# Patient Record
Sex: Male | Born: 1975
Health system: Southern US, Community
[De-identification: ages and names within clinical notes are randomized; demographics above are authoritative.]

## PROBLEM LIST (undated history)

## (undated) ENCOUNTER — Emergency Department (HOSPITAL_BASED_OUTPATIENT_CLINIC_OR_DEPARTMENT_OTHER): Admission: EM | Payer: No Typology Code available for payment source | Source: Home / Self Care

## (undated) HISTORY — PX: HEMORRHOID SURGERY: SHX153

---

## 2004-01-21 ENCOUNTER — Emergency Department (HOSPITAL_COMMUNITY): Admission: EM | Admit: 2004-01-21 | Discharge: 2004-01-21 | Payer: Self-pay | Admitting: Family Medicine

## 2004-11-14 ENCOUNTER — Emergency Department (HOSPITAL_COMMUNITY): Admission: EM | Admit: 2004-11-14 | Discharge: 2004-11-14 | Payer: Self-pay | Admitting: Family Medicine

## 2011-11-13 ENCOUNTER — Ambulatory Visit: Payer: Self-pay | Admitting: Family Medicine

## 2011-11-13 VITALS — BP 112/71 | HR 66 | Temp 98.4°F | Resp 16 | Ht 73.0 in | Wt 276.4 lb

## 2011-11-13 DIAGNOSIS — K645 Perianal venous thrombosis: Secondary | ICD-10-CM

## 2011-11-13 NOTE — Progress Notes (Signed)
  Patient Name: Elijah Hernandez Date of Birth: [birth year] Medical Record Number: [id number] Gender: male Date of Encounter: 11/13/2011  History of Present Illness:  Elijah Hernandez is a 36 y.o. very pleasant male patient who presents with the following:  Here with painful, external hemorrhoid for a few days.  He has tried some OTC creams but to no avail.  No bleeding.  He did have a BM yesterday- but none today.   He is generally healthy.    There is no problem list on file for this patient.  No past medical history on file. No past surgical history on file. History  Substance Use Topics  . Smoking status: Current Everyday Smoker    Types: Cigars  . Smokeless tobacco: Not on file  . Alcohol Use: Not on file   No family history on file. No Known Allergies  Medication list has been reviewed and updated.  Review of Systems: As per HPI- otherwise negative.  Physical Examination: Filed Vitals:   11/13/11 1411  BP: 112/71  Pulse: 66  Temp: 98.4 F (36.9 C)  TempSrc: Oral  Resp: 16  Height: 6\' 1"  (1.854 m)  Weight: 276 lb 6.4 oz (125.374 kg)    Body mass index is 36.47 kg/(m^2).  GEN: WDWN, NAD, Non-toxic, A & O x 3 HEENT: Atraumatic, Normocephalic. Neck supple. No masses, No LAD. Ears and Nose: No external deformity. CV: RRR, No M/G/R. No JVD. No thrill. No extra heart sounds. PULM: CTA B, no wheezes, crackles, rhonchi. No retractions. No resp. distress. No accessory muscle use. ABD: S, NT, ND, +BS. No rebound. No HSM. EXTR: No c/c/e NEURO Normal gait.  PSYCH: Normally interactive. Conversant. Not depressed or anxious appearing.  Calm demeanor.  Large, thrombosed hemorrhoid at 9:00.  VC obtained.  Area cleaned with alcohol pad.  Anesthesia with 1% lidocaine, then excised thrombosis with #11 blade. No complication, large clot expressed  Assessment and Plan: 1. Hemorrhoid thrombosis    Treated as above.  Follow- up if not better in a couple of days, sooner if  prolonged bleeding or other complications.  Sitz baths 2 or 3x daily for the next few days.

## 2012-09-22 ENCOUNTER — Emergency Department (HOSPITAL_COMMUNITY)
Admission: EM | Admit: 2012-09-22 | Discharge: 2012-09-22 | Disposition: A | Discharge status: Home / Self Care | Payer: Self-pay | Attending: Emergency Medicine | Admitting: Emergency Medicine

## 2012-09-22 ENCOUNTER — Encounter (HOSPITAL_COMMUNITY): Payer: Self-pay | Admitting: *Deleted

## 2012-09-22 ENCOUNTER — Emergency Department (HOSPITAL_COMMUNITY): Payer: Self-pay

## 2012-09-22 DIAGNOSIS — F17200 Nicotine dependence, unspecified, uncomplicated: Secondary | ICD-10-CM | POA: Insufficient documentation

## 2012-09-22 DIAGNOSIS — S060X9A Concussion with loss of consciousness of unspecified duration, initial encounter: Secondary | ICD-10-CM

## 2012-09-22 DIAGNOSIS — F1010 Alcohol abuse, uncomplicated: Secondary | ICD-10-CM | POA: Insufficient documentation

## 2012-09-22 DIAGNOSIS — F10929 Alcohol use, unspecified with intoxication, unspecified: Secondary | ICD-10-CM

## 2012-09-22 DIAGNOSIS — W010XXA Fall on same level from slipping, tripping and stumbling without subsequent striking against object, initial encounter: Secondary | ICD-10-CM | POA: Insufficient documentation

## 2012-09-22 DIAGNOSIS — Y939 Activity, unspecified: Secondary | ICD-10-CM | POA: Insufficient documentation

## 2012-09-22 DIAGNOSIS — S060X0A Concussion without loss of consciousness, initial encounter: Secondary | ICD-10-CM | POA: Insufficient documentation

## 2012-09-22 DIAGNOSIS — Y9229 Other specified public building as the place of occurrence of the external cause: Secondary | ICD-10-CM | POA: Insufficient documentation

## 2012-09-22 DIAGNOSIS — S0101XA Laceration without foreign body of scalp, initial encounter: Secondary | ICD-10-CM

## 2012-09-22 MED ORDER — HYDROCODONE-ACETAMINOPHEN 5-325 MG PO TABS: 1.0000 | ORAL_TABLET | ORAL | Status: DC | PRN

## 2012-09-22 MED ORDER — TETANUS-DIPHTH-ACELL PERTUSSIS 5-2.5-18.5 LF-MCG/0.5 IM SUSP
0.5000 mL | Freq: Once | INTRAMUSCULAR | Status: AC
  Administered 2012-09-22: 0.5 mL via INTRAMUSCULAR
  Filled 2012-09-22: qty 0.5

## 2012-09-22 MED ORDER — SODIUM CHLORIDE 0.9 % IV BOLUS (SEPSIS)
1000.0000 mL | Freq: Once | INTRAVENOUS | Status: AC
  Administered 2012-09-22: 1000 mL via INTRAVENOUS

## 2012-09-22 MED ORDER — LIDOCAINE-EPINEPHRINE-TETRACAINE (LET) SOLUTION
3.0000 mL | Freq: Once | NASAL | Status: AC
  Administered 2012-09-22: 3 mL via TOPICAL
  Filled 2012-09-22: qty 3

## 2012-09-22 NOTE — ED Notes (Signed)
0630: Backboard removed. By Ardelle Park

## 2012-09-22 NOTE — ED Provider Notes (Signed)
Medical screening examination/treatment/procedure(s) were performed by non-physician practitioner and as supervising physician I was immediately available for consultation/collaboration.   Skarleth Delmonico, MD 09/22/12 2254 

## 2012-09-22 NOTE — ED Provider Notes (Signed)
History     CSN: [id number]  Arrival date & time 09/22/12  0547   First MD Initiated Contact with Patient 09/22/12 0602      Chief Complaint  Patient presents with  . Fall    (Consider location/radiation/quality/duration/timing/severity/associated sxs/prior treatment) HPI  37 year old male presents to the ED for evaluations of head injury. Patient was brought here to the ER via EMS on spine board and c-collar. Per EMS note patient was at the [facility] house.  He reportedly went outside, and fell on his back hitting his head in the process.  Denies LOC but admits to drinking "some alcohol".  Pt only complaint is headache.   Does not know last tetanus shot.  Denies neck pain, cp, sob, back pain, abd pain, numbness or weakness.   History is limited due to intoxication.  History reviewed. No pertinent past medical history.  History reviewed. No pertinent past surgical history.  History reviewed. No pertinent family history.  History  Substance Use Topics  . Smoking status: Current Every Day Smoker    Types: Cigars  . Smokeless tobacco: Not on file  . Alcohol Use: Yes      Review of Systems  Unable to perform ROS: Other  Intoxication  Allergies  Review of patient's allergies indicates no known allergies.  Home Medications  No current outpatient prescriptions on file.  BP 116/79  Pulse 66  Temp 97.7 F (36.5 C) (Oral)  Resp 14  SpO2 100%  Physical Exam  Nursing note and vitals reviewed. Constitutional: He appears well-developed and well-nourished.       Sleepy but arousable and able to answer simple command  Alcohol breath  HENT:  Head: Normocephalic.       Small occipital head laceration, not actively bleeding.  Ttp.  No crepitus or deformity noted  No hemotympanum, no septal hematoma, no midface tenderness, no malocclusion  Eyes: Pupils are equal, round, and reactive to light.        Pupils 2mm  bilaterally, reactive to light  Neck:       c-collar in place.   Supple.  No midline spine tenderness, stepoff or crepitus  Cardiovascular: Normal rate and regular rhythm.   Pulmonary/Chest: Effort normal and breath sounds normal. He exhibits no tenderness.  Abdominal: Soft. There is no tenderness.  Musculoskeletal: Normal range of motion. He exhibits no edema and no tenderness.  Neurological: He has normal strength. No cranial nerve deficit or sensory deficit. GCS eye subscore is 3. GCS verbal subscore is 5. GCS motor subscore is 5.       A&O x 3    ED Course  Procedures (including critical care time)  LACERATION REPAIR Performed by: Fayrene Helper Authorized by: Fayrene Helper Consent: Verbal consent obtained. Risks and benefits: risks, benefits and alternatives were discussed Consent given by: patient Patient identity confirmed: provided demographic data Prepped and Draped in normal sterile fashion Wound explored  Laceration Location: occipital scalp  Laceration Length: 4cm  No Foreign Bodies seen or palpated  Anesthesia: LET  Local anesthetic: LET  Anesthetic total: 3 ml  Irrigation method: syringe Amount of cleaning: standard  Skin closure: surgical staples  Number of staples: 5  Technique: surgical staple  Patient tolerance: Patient tolerated the procedure well with no immediate complications.   6:23 AM Pt was seen and evaluated.  Pt appears intoxicated and had a recent fall.  Had occipital scalp laceration.  Due to alcohol on board, unable to clear c-spine.  Will obtain head and cspine CT.  Will update tdap, will fix laceration.  Pt otherwise able to answer simple command and in NAD.  VSS  9:02 AM CT scan of his head and neck shows no acute fractures or dislocation. Plan to fix laceration to occipital lobe.  10:01 AM Tdap given.  Lac has been repaired with surgical staples.  Pt is now back to base line and clinically sober.  Is A&Ox3, able to ambulate.  Pt will have family members to come and pick him up.  He is aware of care  instruction for wound care and will return in 5 days for staples removal.  Return sooner if signs of infection.  Will dc with pain meds.  Alcohol cessation discussed.  All questions were answer to patient's satisfaction.   BP 116/79  Pulse 66  Temp 97.7 F (36.5 C) (Oral)  Resp 14  SpO2 100%  I have reviewed nursing notes and vital signs. I personally reviewed the imaging tests through PACS system  I reviewed available ER/hospitalization records thought the EMR   MDM  Patient presented with acute alcohol intoxication. Evidence of head trauma which has been addressed.  Patient observed in the ED for 4 hrs after which was clinically sober. Patient discharged in the care of family member. Information provided on alcohol abuse. Follow up with PCP as needed. Return to ER if alteration of mental status, nausea/vomiting, or other concerns  Impression:   Acute alcohol intoxication Scalp laceration  Plan:   Discharge from ED F/U with PCP PRN Decrease alcohol consumption, binge drinking behavior Return to emergency department urgently if new or worsening symptoms develop.         Fayrene Helper, PA-C 09/22/12 1020

## 2012-09-22 NOTE — ED Notes (Signed)
Patient transported to CT 

## 2012-09-22 NOTE — ED Notes (Signed)
Returned from xray

## 2012-09-22 NOTE — ED Notes (Signed)
[employer]; drank to much. Went outside and fell on back. Hit back of head. No loc. Head wrapped prior to arrival. Good amount of blood on ground. 18 ga lt. A/c 200 cc ns.

## 2012-09-27 ENCOUNTER — Emergency Department (INDEPENDENT_AMBULATORY_CARE_PROVIDER_SITE_OTHER)
Admission: EM | Admit: 2012-09-27 | Discharge: 2012-09-27 | Disposition: A | Discharge status: Home / Self Care | Payer: Self-pay | Source: Home / Self Care | Attending: Family Medicine | Admitting: Family Medicine

## 2012-09-27 ENCOUNTER — Encounter (HOSPITAL_COMMUNITY): Payer: Self-pay | Admitting: Emergency Medicine

## 2012-09-27 DIAGNOSIS — Z4802 Encounter for removal of sutures: Secondary | ICD-10-CM

## 2012-09-27 NOTE — ED Provider Notes (Signed)
History     CSN: [id number]  Arrival date & time 09/27/12  1553   First MD Initiated Contact with Patient 09/27/12 1555      Chief Complaint  Patient presents with  . Suture / Staple Removal    (Consider location/radiation/quality/duration/timing/severity/associated sxs/prior treatment) Patient is a 37 y.o. male presenting with suture removal. The history is provided by the patient.  Suture / Staple Removal  The sutures were placed 3 to 6 days ago. There has been no treatment since the wound repair. There has been no drainage from the wound. There is no redness present. There is no swelling present. The pain has no pain.    History reviewed. No pertinent past medical history.  Past Surgical History  Procedure Date  . Hemorrhoid surgery     No family history on file.  History  Substance Use Topics  . Smoking status: Current Every Day Smoker    Types: Cigars  . Smokeless tobacco: Not on file  . Alcohol Use: Yes      Review of Systems  Constitutional: Negative.   Neurological: Negative.     Allergies  Review of patient's allergies indicates no known allergies.  Home Medications   Current Outpatient Rx  Name  Route  Sig  Dispense  Refill  . HYDROCODONE-ACETAMINOPHEN 5-325 MG PO TABS   Oral   Take 1 tablet by mouth every 4 (four) hours as needed for pain.   12 tablet   0     BP 119/75  Pulse 51  Temp 98.3 F (36.8 C) (Oral)  Resp 18  SpO2 100%  Physical Exam  Nursing note and vitals reviewed. Constitutional: He is oriented to person, place, and time. He appears well-developed and well-nourished.  Neurological: He is alert and oriented to person, place, and time.  Skin: Skin is warm and dry.       Lac to scalp well healed, staples removed, no problems.    ED Course  Procedures (including critical care time)  Labs Reviewed - No data to display No results found.   1. Removal of staples       MDM  Staples removed without  problem         Linna Hoff, MD 09/27/12 [phone]

## 2012-09-27 NOTE — ED Notes (Signed)
Staples to back of scalp, placed at [facility] on 09/22/12.  Site unremarkable

## 2015-10-17 ENCOUNTER — Encounter (HOSPITAL_BASED_OUTPATIENT_CLINIC_OR_DEPARTMENT_OTHER): Payer: Self-pay

## 2015-10-17 ENCOUNTER — Emergency Department (HOSPITAL_BASED_OUTPATIENT_CLINIC_OR_DEPARTMENT_OTHER): Payer: 59

## 2015-10-17 ENCOUNTER — Emergency Department (HOSPITAL_BASED_OUTPATIENT_CLINIC_OR_DEPARTMENT_OTHER)
Admission: EM | Admit: 2015-10-17 | Discharge: 2015-10-17 | Disposition: A | Discharge status: Home / Self Care | Payer: 59 | Attending: Emergency Medicine | Admitting: Emergency Medicine

## 2015-10-17 DIAGNOSIS — R52 Pain, unspecified: Secondary | ICD-10-CM

## 2015-10-17 DIAGNOSIS — F17210 Nicotine dependence, cigarettes, uncomplicated: Secondary | ICD-10-CM | POA: Diagnosis not present

## 2015-10-17 DIAGNOSIS — M79671 Pain in right foot: Secondary | ICD-10-CM

## 2015-10-17 DIAGNOSIS — Z736 Limitation of activities due to disability: Secondary | ICD-10-CM | POA: Diagnosis not present

## 2015-10-17 NOTE — ED Notes (Signed)
Pt presents with rt foot pain, near 4/5toes , pain increases with dorsal flexion, no pain with plantar flexion, no swelling or deformity or discoloration noted to rt foot area, states pain as progressively increased over the past week, ambulation increases pain.

## 2015-10-17 NOTE — Discharge Instructions (Signed)
1. Medications: tylenol or ibuprofen for pain, usual home medications 2. Treatment: rest, drink plenty of fluids, wear shoe for comfort, ice and elevate 3. Follow Up: please followup with orthopedics for persistent symptoms for discussion of your diagnoses and further evaluation after today's visit; if you do not have a primary care doctor use the resource guide provided to find one; please return to the ER for increased pain, swelling, numbness, new or worsening symptoms   Musculoskeletal Pain Musculoskeletal pain is muscle and boney aches and pains. These pains can occur in any part of the body. Your caregiver may treat you without knowing the cause of the pain. They may treat you if blood or urine tests, X-rays, and other tests were normal.  CAUSES There is often not a definite cause or reason for these pains. These pains may be caused by a type of germ (virus). The discomfort may also come from overuse. Overuse includes working out too hard when your body is not fit. Boney aches also come from weather changes. Bone is sensitive to atmospheric pressure changes. HOME CARE INSTRUCTIONS   Ask when your test results will be ready. Make sure you get your test results.  Only take over-the-counter or prescription medicines for pain, discomfort, or fever as directed by your caregiver. If you were given medications for your condition, do not drive, operate machinery or power tools, or sign legal documents for 24 hours. Do not drink alcohol. Do not take sleeping pills or other medications that may interfere with treatment.  Continue all activities unless the activities cause more pain. When the pain lessens, slowly resume normal activities. Gradually increase the intensity and duration of the activities or exercise.  During periods of severe pain, bed rest may be helpful. Lay or sit in any position that is comfortable.  Putting ice on the injured area.  Put ice in a bag.  Place a towel between your  skin and the bag.  Leave the ice on for 15 to 20 minutes, 3 to 4 times a day.  Follow up with your caregiver for continued problems and no reason can be found for the pain. If the pain becomes worse or does not go away, it may be necessary to repeat tests or do additional testing. Your caregiver may need to look further for a possible cause. SEEK IMMEDIATE MEDICAL CARE IF:  You have pain that is getting worse and is not relieved by medications.  You develop chest pain that is associated with shortness or breath, sweating, feeling sick to your stomach (nauseous), or throw up (vomit).  Your pain becomes localized to the abdomen.  You develop any new symptoms that seem different or that concern you. MAKE SURE YOU:   Understand these instructions.  Will watch your condition.  Will get help right away if you are not doing well or get worse.   This information is not intended to replace advice given to you by your health care provider. Make sure you discuss any questions you have with your health care provider.   Document Released: 08/07/2005 Document Revised: 10/30/2011 Document Reviewed: 04/11/2013 Elsevier Interactive Patient Education [employer]

## 2015-10-17 NOTE — ED Notes (Signed)
Patient here with ongoing right foot pain, reports that he stands a lot at work and thinks may be related, denies trauma. Pain worse with ambulation and reports all the pain on top of his foot

## 2015-10-17 NOTE — ED Provider Notes (Signed)
CSN: [id number]     Arrival date & time 10/17/15  1147 History   First MD Initiated Contact with Patient 10/17/15 1212     Chief Complaint  Patient presents with  . Foot Pain    HPI   Elijah Hernandez is a 40 y.o. male with no pertinent PMH who presents to the ED with right foot pain, which he states has been present for the past 2 weeks and progressively worsened since that time. He notes his pain has been constant since Thursday. He reports movement and bearing weight exacerbate his pain. He has tried ibuprofen at home with minimal symptom relief. He denies recent injury, though states he is on his feet frequently, as he works in a [employer]. He denies numbness, weakness, paresthesia.   History reviewed. No pertinent past medical history. Past Surgical History  Procedure Laterality Date  . Hemorrhoid surgery     No family history on file. Social History  Substance Use Topics  . Smoking status: Current Every Day Smoker    Types: Cigars  . Smokeless tobacco: None  . Alcohol Use: Yes     Review of Systems  Musculoskeletal: Positive for arthralgias.  Neurological: Negative for weakness and numbness.      Allergies  Review of patient's allergies indicates no known allergies.  Home Medications   Prior to Admission medications   Medication Sig Start Date End Date Taking? Authorizing Provider  ibuprofen (ADVIL,MOTRIN) 600 MG tablet Take 600 mg by mouth every 8 (eight) hours as needed.   Yes Historical Provider, MD    BP 117/70 mmHg  Pulse 60  Temp(Src) 98.4 F (36.9 C) (Oral)  Resp 18  Ht  (1.854 m)  Wt 127.007 kg  BMI 36.95 kg/m2  SpO2 100% Physical Exam  Constitutional: He is oriented to person, place, and time. He appears well-developed and well-nourished. No distress.  HENT:  Head: Normocephalic and atraumatic.  Right Ear: External ear normal.  Left Ear: External ear normal.  Nose: Nose normal.  Eyes: Conjunctivae and EOM are normal. Right eye exhibits no  discharge. Left eye exhibits no discharge. No scleral icterus.  Neck: Normal range of motion. Neck supple.  Cardiovascular: Normal rate, regular rhythm and intact distal pulses.   Pulmonary/Chest: Effort normal and breath sounds normal. No respiratory distress.  Musculoskeletal: Normal range of motion. He exhibits tenderness. He exhibits no edema.  Tenderness to palpation to dorsal aspect of right third, fourth, and fifth MTPs. No edema, erythema, or heat. Full range of motion of right foot and toes. Distal pulses intact. Strength and sensation intact.  Neurological: He is alert and oriented to person, place, and time. He has normal strength. No sensory deficit.  Skin: Skin is warm and dry. He is not diaphoretic.  Psychiatric: He has a normal mood and affect. His behavior is normal.  Nursing note and vitals reviewed.   ED Course  Procedures (including critical care time)  Labs Review Labs Reviewed - No data to display  Imaging Review Dg Foot Complete Right  10/17/2015  CLINICAL DATA:  40 year old presenting with right foot pain which began 2 weeks ago, acutely worsened in the past 2 days, pain localizing to the 3rd, 4th and 5th MTP joints. No known injury. EXAM: RIGHT FOOT COMPLETE - 3+ VIEW COMPARISON:  None. FINDINGS: No evidence of acute or subacute fracture or dislocation. Joint spaces well preserved. Well-preserved bone mineral density. Very small enthesopathic spur on the posterior calcaneus at the insertion of the Achilles  tendon. No significant intrinsic osseous abnormalities. IMPRESSION: No acute, subacute or significant osseous abnormality. Electronically Signed   By: Hulan Saas M.D.   On: 10/17/2015 12:49   I have personally reviewed and evaluated these images as part of my medical decision-making.   EKG Interpretation None      MDM   Final diagnoses:  Pain aggravated by activities of daily living  Right foot pain    40 year old male presents with right foot pain.  Denies injury. Denies numbness, weakness, paresthesia. Patient is afebrile. Vital signs stable. On exam, he has tenderness to palpation to the dorsal aspect of his right third, fourth, and fifth MTPs. No edema, erythema, or heat. Full range of motion. Patient is neurovascularly intact. Imaging negative for acute, subacute, or significant osseous abnormality. Spoke with patient regarding findings. Will place in hard soled shoe for comfort. Advised to rest, ice, elevate, and take tylenol or ibuprofen for pain. Patient to follow-up with ortho for persistent symptoms. Return precautions discussed. Patient verbalizes his understanding and is in agreement with plan.  BP 117/70 mmHg  Pulse 60  Temp(Src) 98.4 F (36.9 C) (Oral)  Resp 18  Ht  (1.854 m)  Wt 127.007 kg  BMI 36.95 kg/m2  SpO2 100%     Mady Gemma, PA-C 10/17/15 1610  Rolan Bucco, MD 10/18/15 1525

## 2017-01-26 ENCOUNTER — Telehealth: Payer: Self-pay | Admitting: *Deleted

## 2017-01-26 NOTE — Telephone Encounter (Signed)
PreVisit Call attempted. Pt was unaware of appt. States his wife made the appt and would have to check with his work about getting PTO. Pt states he will call back. Unable to complete PreVisit Call.

## 2017-01-29 ENCOUNTER — Ambulatory Visit: Payer: 59 | Admitting: Physician Assistant

## 2018-04-14 ENCOUNTER — Encounter (HOSPITAL_BASED_OUTPATIENT_CLINIC_OR_DEPARTMENT_OTHER): Payer: Self-pay | Admitting: Emergency Medicine

## 2018-04-14 ENCOUNTER — Other Ambulatory Visit: Payer: Self-pay

## 2018-04-14 ENCOUNTER — Emergency Department (HOSPITAL_BASED_OUTPATIENT_CLINIC_OR_DEPARTMENT_OTHER)
Admission: EM | Admit: 2018-04-14 | Discharge: 2018-04-14 | Disposition: A | Discharge status: Home / Self Care | Payer: 59 | Attending: Emergency Medicine | Admitting: Emergency Medicine

## 2018-04-14 DIAGNOSIS — F17210 Nicotine dependence, cigarettes, uncomplicated: Secondary | ICD-10-CM | POA: Insufficient documentation

## 2018-04-14 DIAGNOSIS — M5432 Sciatica, left side: Secondary | ICD-10-CM | POA: Insufficient documentation

## 2018-04-14 MED ORDER — MELOXICAM 15 MG PO TABS
15.0000 mg | ORAL_TABLET | Freq: Every day | ORAL | 0 refills | Status: AC
Start: 2018-04-14 — End: ?

## 2018-04-14 MED ORDER — PREDNISONE 10 MG (21) PO TBPK: ORAL_TABLET | ORAL | 0 refills | Status: AC

## 2018-04-14 MED ORDER — CYCLOBENZAPRINE HCL 10 MG PO TABS
5.0000 mg | ORAL_TABLET | Freq: Two times a day (BID) | ORAL | 0 refills | Status: AC | PRN
Start: 2018-04-14 — End: ?

## 2018-04-14 NOTE — ED Provider Notes (Signed)
[facility] EMERGENCY DEPARTMENT Provider Note   CSN: [id number] Arrival date & time: 04/14/18  1112     History   Chief Complaint Chief Complaint  Patient presents with  . Back Pain    HPI Elijah Hernandez is a 42 y.o. male.  Who presents emergency department chief complaint of left-sided sciatica symptoms.  Patient works in a [employer].  After he got off of work Friday night he noticed that he had some pain in his lower back radiating down the the back of his leg.  Patient states that he is fine at rest but anytime he tries to lean over such as putting on his sock he has sharp shooting pain down the back of the leg.  He states that he had something similar when he was about 42 years old but has not had any issues with it since.  He denies weakness, numbness, saddle anesthesia, loss of bowel or bladder control.  He does not use IV drugs and has had no procedures to his back.  He has been using ibuprofen.  Took 1 of his girlfriends muscle relaxers this morning which helped somewhat but caused him to be sleepy.  He states the pain is achy and at times sharp and electrical.  HPI  History reviewed. No pertinent past medical history.  There are no active problems to display for this patient.   Past Surgical History:  Procedure Laterality Date  . HEMORRHOID SURGERY          Home Medications    Prior to Admission medications   Medication Sig Start Date End Date Taking? Authorizing Provider  ibuprofen (ADVIL,MOTRIN) 600 MG tablet Take 600 mg by mouth every 8 (eight) hours as needed.    [provider]    Family History No family history on file.  Social History Social History   Tobacco Use  . Smoking status: Current Every Day Smoker    Types: Cigars  . Smokeless tobacco: Never Used  Substance Use Topics  . Alcohol use: Yes  . Drug use: No     Allergies   Patient has no known allergies.   Review of Systems Review of Systems Ten systems reviewed  and are negative for acute change, except as noted in the HPI.    Physical Exam Updated Vital Signs BP 124/83 (BP Location: Right Arm)   Pulse 60   Temp 98.2 F (36.8 C) (Oral)   Resp 16   Ht 6\' 1"  (1.854 m)   Wt 108.9 kg   SpO2 100%   BMI 31.66 kg/m   Physical Exam  Constitutional: He is oriented to person, place, and time. He appears well-developed and well-nourished. No distress.  HENT:  Head: Normocephalic and atraumatic.  Eyes: Conjunctivae are normal. No scleral icterus.  Neck: Normal range of motion. Neck supple.  Cardiovascular: Normal rate, regular rhythm and normal heart sounds.  Pulmonary/Chest: Effort normal and breath sounds normal. No respiratory distress.  Abdominal: Soft. There is no tenderness.  Musculoskeletal: He exhibits no edema.  Patient appears to be in mild to moderate pain, antalgic gait noted. Lumbosacral spine area reveals no local tenderness or mass. Painful and reduced LS ROM noted. Straight leg raise is positive at 45 degrees on left. DTR's, motor strength and sensation normal, including heel and toe gait.  Peripheral pulses are palpable.  Neurological: He is alert and oriented to person, place, and time.  Skin: Skin is warm and dry. He is not diaphoretic.  Psychiatric: His  behavior is normal.  Nursing note and vitals reviewed.    ED Treatments / Results  Labs (all labs ordered are listed, but only abnormal results are displayed) Labs Reviewed - No data to display  EKG None  Radiology No results found.  Procedures Procedures (including critical care time)  Medications Ordered in ED Medications - No data to display   Initial Impression / Assessment and Plan / ED Course  I have reviewed the triage vital signs and the nursing notes.  Pertinent labs & imaging results that were available during my care of the patient were reviewed by me and considered in my medical decision making (see chart for details).    With classic symptoms of  sciatica.   No neurological deficits and normal neuro exam.  Patient can walk but states is painful.  No loss of bowel or bladder control.  No concern for cauda equina.  No fever, night sweats, weight loss, h/o cancer, IVDU.  RICE protocol and pain medicine indicated and discussed with patient.    Final Clinical Impressions(s) / ED Diagnoses   Final diagnoses:  Sciatica of left side    ED Discharge Orders    None       Arthor CaptainHarris, Shatara Stanek, PA-C 04/14/18 1315    Mesner, Barbara CowerJason, MD 04/14/18 1450

## 2018-04-14 NOTE — ED Triage Notes (Signed)
L lower back pain x 3 days. Denies injury.

## 2018-04-14 NOTE — Discharge Instructions (Addendum)
SEEK IMMEDIATE MEDICAL ATTENTION IF: New numbness, tingling, weakness, or problem with the use of your arms or legs.  Severe back pain not relieved with medications.  Change in bowel or bladder control.  Increasing pain in any areas of the body (such as chest or abdominal pain).  Shortness of breath, dizziness or fainting.  Nausea (feeling sick to your stomach), vomiting, fever, or sweats.  

## 2018-04-14 NOTE — ED Notes (Signed)
Pt states that he worked Friday 8/23  and experienced no pain in back until Friday evening after work, that pain progressed slowly, and worsened over following 2 days and now experiencing shooting pain down left leg. He states pain is not constant but increases with movement. Decreased pain when at rest, but pain does present as a dull ache.

## 2019-11-29 ENCOUNTER — Emergency Department (HOSPITAL_BASED_OUTPATIENT_CLINIC_OR_DEPARTMENT_OTHER): Payer: No Typology Code available for payment source

## 2019-11-29 ENCOUNTER — Other Ambulatory Visit: Payer: Self-pay

## 2019-11-29 ENCOUNTER — Emergency Department (HOSPITAL_BASED_OUTPATIENT_CLINIC_OR_DEPARTMENT_OTHER)
Admission: EM | Admit: 2019-11-29 | Discharge: 2019-11-29 | Disposition: A | Discharge status: Home / Self Care | Payer: No Typology Code available for payment source | Attending: Emergency Medicine | Admitting: Emergency Medicine

## 2019-11-29 ENCOUNTER — Encounter (HOSPITAL_BASED_OUTPATIENT_CLINIC_OR_DEPARTMENT_OTHER): Payer: Self-pay | Admitting: Emergency Medicine

## 2019-11-29 DIAGNOSIS — R9389 Abnormal findings on diagnostic imaging of other specified body structures: Secondary | ICD-10-CM

## 2019-11-29 DIAGNOSIS — R197 Diarrhea, unspecified: Secondary | ICD-10-CM | POA: Diagnosis present

## 2019-11-29 DIAGNOSIS — D696 Thrombocytopenia, unspecified: Secondary | ICD-10-CM | POA: Insufficient documentation

## 2019-11-29 DIAGNOSIS — E860 Dehydration: Secondary | ICD-10-CM | POA: Diagnosis not present

## 2019-11-29 DIAGNOSIS — R55 Syncope and collapse: Secondary | ICD-10-CM | POA: Insufficient documentation

## 2019-11-29 DIAGNOSIS — Z20822 Contact with and (suspected) exposure to covid-19: Secondary | ICD-10-CM | POA: Insufficient documentation

## 2019-11-29 DIAGNOSIS — R918 Other nonspecific abnormal finding of lung field: Secondary | ICD-10-CM | POA: Diagnosis not present

## 2019-11-29 DIAGNOSIS — F17290 Nicotine dependence, other tobacco product, uncomplicated: Secondary | ICD-10-CM | POA: Insufficient documentation

## 2019-11-29 LAB — COMPREHENSIVE METABOLIC PANEL
ALT: 21 U/L (ref 0–44)
AST: 23 U/L (ref 15–41)
Albumin: 4 g/dL (ref 3.5–5.0)
Alkaline Phosphatase: 53 U/L (ref 38–126)
Anion gap: 8 (ref 5–15)
BUN: 17 mg/dL (ref 6–20)
CO2: 25 mmol/L (ref 22–32)
Calcium: 9.1 mg/dL (ref 8.9–10.3)
Chloride: 105 mmol/L (ref 98–111)
Creatinine, Ser: 1.23 mg/dL (ref 0.61–1.24)
GFR calc Af Amer: >60 mL/min (ref >60)
GFR calc non Af Amer: >60 mL/min (ref >60)
Glucose, Bld: 98 mg/dL (ref 70–99)
Potassium: 4.3 mmol/L (ref 3.5–5.1)
Sodium: 138 mmol/L (ref 135–145)
Total Bilirubin: 1 mg/dL (ref 0.3–1.2)
Total Protein: 7.4 g/dL (ref 6.5–8.1)

## 2019-11-29 LAB — CBC WITH DIFFERENTIAL/PLATELET
Abs Immature Granulocytes: 0.03 10*3/uL (ref 0.00–0.07)
Basophils Absolute: 0 10*3/uL (ref 0.0–0.1)
Basophils Relative: 0 %
Eosinophils Absolute: 0.5 10*3/uL (ref 0.0–0.5)
Eosinophils Relative: 6 %
HCT: 50.2 % (ref 39.0–52.0)
Hemoglobin: 15.6 g/dL (ref 13.0–17.0)
Immature Granulocytes: 0 %
Lymphocytes Relative: 7 %
Lymphs Abs: 0.5 10*3/uL — ABNORMAL LOW (ref 0.7–4.0)
MCH: 29.4 pg (ref 26.0–34.0)
MCHC: 31.1 g/dL (ref 30.0–36.0)
MCV: 94.5 fL (ref 80.0–100.0)
Monocytes Absolute: 0.6 10*3/uL (ref 0.1–1.0)
Monocytes Relative: 8 %
Neutro Abs: 6.3 10*3/uL (ref 1.7–7.7)
Neutrophils Relative %: 79 %
Platelets: 86 10*3/uL — ABNORMAL LOW (ref 150–400)
RBC: 5.31 MIL/uL (ref 4.22–5.81)
RDW: 12.4 % (ref 11.5–15.5)
Smear Review: NORMAL
WBC: 7.9 10*3/uL (ref 4.0–10.5)
nRBC: 0 % (ref 0.0–0.2)

## 2019-11-29 LAB — LIPASE, BLOOD: Lipase: 31 U/L (ref 11–51)

## 2019-11-29 MED ORDER — SODIUM CHLORIDE 0.9 % IV SOLN: Freq: Once | INTRAVENOUS | Status: AC

## 2019-11-29 MED ORDER — ONDANSETRON HCL 4 MG/2ML IJ SOLN
4.0000 mg | Freq: Once | INTRAMUSCULAR | Status: AC
  Administered 2019-11-29: 4 mg via INTRAVENOUS
  Filled 2019-11-29: qty 2

## 2019-11-29 NOTE — ED Provider Notes (Signed)
[facility] EMERGENCY DEPARTMENT Provider Note   CSN: [id number] Arrival date & time: 11/29/19  1538     History Chief Complaint  Patient presents with  . Diarrhea  . Loss of Consciousness    Elijah Hernandez is a 44 y.o. male.  HPI  Patient is a 44 year old male with no pertinent past medical history presented today with diarrhea that began this morning has been watery profuse and approximately 7 episodes of this.  He states that he walked upstairs to go to the restroom walked into the bathroom and passed out briefly.  He states that his wife was immediately at his side and and she denies any seizure-like activity.  Patient states that he did "bump his head "on the way down however he states he has no headache, nausea, vomiting, weakness or fatigue.  He states he feels completely asymptomatic at this time.  He states he has not had an episode of diarrhea since the episode of syncope.  He denies any cardiac history, denies any chest pain, shortness of breath, palpitations.  Denies any lateral leg swelling, denies any headache, denies any history of anemia.  Patient states he came to ED for reassessment.  Denies any fevers or bloody diarrhea or dark or tarry stools.  States he has no history of similar episodes.     History reviewed. No pertinent past medical history.  There are no problems to display for this patient.   Past Surgical History:  Procedure Laterality Date  . HEMORRHOID SURGERY         No family history on file.  Social History   Tobacco Use  . Smoking status: Current Every Day Smoker    Types: Cigars  . Smokeless tobacco: Never Used  Substance Use Topics  . Alcohol use: Yes  . Drug use: No    Home Medications Prior to Admission medications   Medication Sig Start Date End Date Taking? Authorizing Provider  cyclobenzaprine (FLEXERIL) 10 MG tablet Take 0.5-1 tablets (5-10 mg total) by mouth 2 (two) times daily as needed for muscle spasms.  04/14/18   Arthor Captain, PA-C  ibuprofen (ADVIL,MOTRIN) 600 MG tablet Take 600 mg by mouth every 8 (eight) hours as needed.    [provider]  meloxicam (MOBIC) 15 MG tablet Take 1 tablet (15 mg total) by mouth daily. 04/14/18   Arthor Captain, PA-C  predniSONE (STERAPRED UNI-PAK 21 TAB) 10 MG (21) TBPK tablet Use as directed 04/14/18   Arthor Captain, PA-C    Allergies    Patient has no known allergies.  Review of Systems   Review of Systems  Constitutional: Negative for chills and fever.  HENT: Negative for congestion.   Respiratory: Negative for shortness of breath.   Cardiovascular: Negative for chest pain.  Gastrointestinal: Positive for diarrhea and nausea. Negative for abdominal pain and vomiting.  Musculoskeletal: Negative for neck pain.  Neurological: Positive for syncope.    Physical Exam Updated Vital Signs BP 109/73 (BP Location: Left Arm)   Pulse 88   Temp 98.6 F (37 C) (Oral)   Resp 18   Ht 6\' 1"  (1.854 m)   Wt 111.1 kg   SpO2 99%   BMI 32.32 kg/m   Physical Exam Vitals and nursing note reviewed.  Constitutional:      General: He is not in acute distress.    Comments: Pleasant 44 year old male in no acute distress sitting easily in bed.  Able answer questions and follow commands.  HENT:  Head: Normocephalic and atraumatic.     Nose: Nose normal.     Mouth/Throat:     Mouth: Mucous membranes are dry.  Eyes:     General: No scleral icterus. Cardiovascular:     Rate and Rhythm: Normal rate and regular rhythm.     Pulses: Normal pulses.     Heart sounds: Normal heart sounds.  Pulmonary:     Effort: Pulmonary effort is normal. No respiratory distress.     Breath sounds: No wheezing.  Abdominal:     Palpations: Abdomen is soft.     Tenderness: There is no abdominal tenderness. There is no right CVA tenderness, left CVA tenderness, guarding or rebound.  Musculoskeletal:     Cervical back: Normal range of motion.     Right lower leg: No  edema.     Left lower leg: No edema.  Skin:    General: Skin is warm and dry.     Capillary Refill: Capillary refill takes less than 2 seconds.  Neurological:     Mental Status: He is alert. Mental status is at baseline.     Comments: Alert and oriented to self, place, time and event.   Speech is fluent, clear without dysarthria or dysphasia.   Strength 5/5 in upper/lower extremities  Sensation intact in upper/lower extremities   Normal gait.  Negative Romberg. No pronator drift.  Normal finger-to-nose and feet tapping.  CN I not tested  CN II grossly intact visual fields bilaterally. Did not visualize posterior eye.   CN III, IV, VI PERRLA and EOMs intact bilaterally  CN V Intact sensation to sharp and light touch to the face  CN VII facial movements symmetric  CN VIII not tested  CN IX, X no uvula deviation, symmetric rise of soft palate  CN XI 5/5 SCM and trapezius strength bilaterally  CN XII Midline tongue protrusion, symmetric L/R movements   Psychiatric:        Mood and Affect: Mood normal.        Behavior: Behavior normal.     ED Results / Procedures / Treatments   Labs (all labs ordered are listed, but only abnormal results are displayed) Labs Reviewed  CBC WITH DIFFERENTIAL/PLATELET - Abnormal; Notable for the following components:      Result Value   Platelets 86 (*)    Lymphs Abs 0.5 (*)    All other components within normal limits  SARS CORONAVIRUS 2 (TAT 6-24 HRS)  COMPREHENSIVE METABOLIC PANEL  LIPASE, BLOOD    EKG EKG Interpretation  Date/Time:  Saturday November 29 2019 16:19:36 EDT Ventricular Rate:  65 PR Interval:    QRS Duration: 97 QT Interval:  386 QTC Calculation: 402 R Axis:   61 Text Interpretation: Sinus rhythm No previous ECGs available Confirmed by Theotis Burrow [phone]) on 11/29/2019 4:56:19 PM Also confirmed by Theotis Burrow [phone]), editor [place], [place] [phone])  on 11/30/2019 8:52:18 AM   Radiology DG Chest Portable 1  View  Result Date: 11/29/2019 CLINICAL DATA:  Syncope. EXAM: PORTABLE CHEST 1 VIEW COMPARISON:  None. FINDINGS: The heart, hila, and mediastinum are normal. No pneumothorax. Subtle density/infiltrate in the right mid lung. No other acute abnormalities. IMPRESSION: Subtle density/infiltrate in the right mid lung may represent pneumonia or aspiration. Recommend short-term follow-up imaging to ensure resolution. Electronically Signed   By: Dorise Bullion III M.D   On: 11/29/2019 16:33    Procedures Procedures (including critical care time)  Medications Ordered in ED Medications  0.9 %  sodium chloride infusion ( Intravenous Stopped 11/29/19 1733)  ondansetron (ZOFRAN) injection 4 mg (4 mg Intravenous Given 11/29/19 1617)    ED Course  I have reviewed the triage vital signs and the nursing notes.  Pertinent labs & imaging results that were available during my care of the patient were reviewed by me and considered in my medical decision making (see chart for details).    MDM Rules/Calculators/A&P                      Patient is a 44 year old male with no significant past medical history presented today for diarrhea that began today and a single episode of syncope.  He states that he was feeling lightheaded prior to the episode.  Is somewhat nauseous right before the episode occurred.  He does not have any red flag prodromal symptoms such as headache, shortness of breath, chest pain, abdominal pain.  He states that he felt lightheaded several times before when he stood up but states that he felt better after a moment of stabbing himself.  Physical exam is notable for dry mucous membranes however patient does have skin with good turgor does not appear to be emergently dehydrated however as he has had an episode of syncope will rehydrate check electrolytes obtain EKG, basic labs and chest x-ray to evaluate for mediastinal widening.  Chest x-ray apparently reviewed myself.  There is a small  irregularity in the right middle lobe but is likely a incidental finding as patient has absolutely no shortness of breath, cough, wheezing, or other pulmonary complaints.  I reviewed this finding with the patient and recommended close follow-up with PCP for repeat x-ray.  Patient also on CBC has a mild incidental thrombocytopenia of 86.  I discussed this finding patient as well and recommended follow-up with PCP and repeat CBC in 2 to 3 weeks.  He is aware of these will abnormalities.  No abnormalities in electrolytes, potassium is within normal months.  Lipase is within normal limits and Covid test negative.  Patient's vital signs are within normal limits at this time.  I reassessed patient and continues to have no abdominal pain or tenderness to palpation of abdomen.  He continues to feel well.  EKG is within normal limits as well.  No evidence of ischemia.  There is some mild BER.  Patient feels significantly improved and is requesting discharge this time.  After shared decision-making ambulation with wife at bedside and patient will discharge with close follow-up with PCP.  Patient given recommendations for diet will help with diarrhea and given hydration recommendations.    -----  The medical records were personally reviewed by myself. I personally reviewed all lab results and interpreted all imaging studies and either concurred with their official read or contacted radiology for clarification. Additional history obtained from old records/EMS/family members.  This patient appears reasonably screened and I doubt any other medical condition requiring further workup, evaluation, or treatment in the ED at this time prior to discharge.   Patient's vitals are WNL apart from vital sign abnormalities discussed above, patient is in NAD, and able to ambulate in the ED at their baseline and able to tolerate PO.  I discussed this case with my attending physician who cosigned this note including patient's  presenting symptoms, physical exam, and planned diagnostics and interventions. Attending physician stated agreement with plan or made changes to plan which were implemented.   Pain has been managed or a plan has been made for  home management and has no complaints prior to discharge. Patient is comfortable with above plan and for discharge at this time. All questions were answered prior to disposition. Results from the ER workup discussed with the patient face to face and all questions answered to the best of my ability. The patient is safe for discharge with strict return precautions. Patient appears safe for discharge with appropriate follow-up. Conveyed my impression with the patient and they voiced understanding and are agreeable to plan.   An After Visit Summary was printed and given to the patient.  Portions of this note were generated with Scientist, clinical (histocompatibility and immunogenetics). Dictation errors may occur despite best attempts at proofreading.    Final Clinical Impression(s) / ED Diagnoses Final diagnoses:  Dehydration  Diarrhea, unspecified type  Thrombocytopenia (HCC)  Abnormal chest x-ray  Suspected COVID-19 virus infection    Rx / DC Orders ED Discharge Orders    None       Gailen Shelter, [place] 11/30/19 0913    Little, Ambrose [place], MD 11/30/19 1456

## 2019-11-29 NOTE — ED Notes (Signed)
Reports nausea relief.  Ginger ale provided for po challenge.

## 2019-11-29 NOTE — ED Triage Notes (Signed)
Reports having diarrhea today (6-7x). He states he suddenly became hot and nauseated and passed out in his bedroom, striking head on the dresser. States he feels good right now, denies pain.

## 2019-11-29 NOTE — Discharge Instructions (Addendum)
Please use loperamide as an over-the-counter medication for diarrhea.  Please eat the diet that I recommended for diarrhea.  Please follow-up with your primary care doctor in 2 to 3 weeks for reevaluation of your mildly abnormal blood count/platelets and your small incidental abnormality, and your chest x-ray.  As we discussed this is not concerning for an acute issue or something related to was going today however it should be followed up on.  Please use Pedialyte and water to hydrate.  Please return to ED for any new or concerning symptoms.  As we discussed if you experience a fever, abdominal pain, vomiting, or new or concerning symptoms please return to ED for reevaluation.     Your COVID test is pending; you should expect results in 2-3 days. You can access your results on your MyChart--if you test positive you should receive a phone call.  In the meantime follow CDC guidelines and quarantine, wear a mask, wash hands often.   Please take over the counter vitamin D 2000-4000 units per day. I also recommend zinc 50 mg per day for the next two weeks.   Please return to ED if you feel have difficulty breathing or have emergent, new or concerning symptoms.  Patients who have symptoms consistent with COVID-19 should self isolated for: At least 3 days (72 hours) have passed since recovery, defined as resolution of fever without the use of fever reducing medications and improvement in respiratory symptoms (e.g., cough, shortness of breath), and At least 7 days have passed since symptoms first appeared.       Person Under Monitoring Name: Elijah Hernandez  Location: [address] [place] [address]   Infection Prevention Recommendations for Individuals Confirmed to have, or Being Evaluated for, 2019 Novel Coronavirus (COVID-19) Infection Who Receive Care at Home  Individuals who are confirmed to have, or are being evaluated for, COVID-19 should follow the prevention steps below until  a healthcare provider or local or state health department says they can return to normal activities.  Stay home except to get medical care You should restrict activities outside your home, except for getting medical care. Do not go to work, school, or public areas, and do not use public transportation or taxis.  Call ahead before visiting your doctor Before your medical appointment, call the healthcare provider and tell them that you have, or are being evaluated for, COVID-19 infection. This will help the healthcare provider's office take steps to keep other people from getting infected. Ask your healthcare provider to call the local or state health department.  Monitor your symptoms Seek prompt medical attention if your illness is worsening (e.g., difficulty breathing). Before going to your medical appointment, call the healthcare provider and tell them that you have, or are being evaluated for, COVID-19 infection. Ask your healthcare provider to call the local or state health department.  Wear a facemask You should wear a facemask that covers your nose and mouth when you are in the same room with other people and when you visit a healthcare provider. People who live with or visit you should also wear a facemask while they are in the same room with you.  Separate yourself from other people in your home As much as possible, you should stay in a different room from other people in your home. Also, you should use a separate bathroom, if available.  Avoid sharing household items You should not share dishes, drinking glasses, cups, eating utensils, towels, bedding, or other items  with other people in your home. After using these items, you should wash them thoroughly with soap and water.  Cover your coughs and sneezes Cover your mouth and nose with a tissue when you cough or sneeze, or you can cough or sneeze into your sleeve. Throw used tissues in a lined trash can, and immediately wash  your hands with soap and water for at least 20 seconds or use an alcohol-based hand rub.  Wash your [employer] your hands often and thoroughly with soap and water for at least 20 seconds. You can use an alcohol-based hand sanitizer if soap and water are not available and if your hands are not visibly dirty. Avoid touching your eyes, nose, and mouth with unwashed hands.   Prevention Steps for Caregivers and Household Members of Individuals Confirmed to have, or Being Evaluated for, COVID-19 Infection Being Cared for in the Home  If you live with, or provide care at home for, a person confirmed to have, or being evaluated for, COVID-19 infection please follow these guidelines to prevent infection:  Follow healthcare provider's instructions Make sure that you understand and can help the patient follow any healthcare provider instructions for all care.  Provide for the patient's basic needs You should help the patient with basic needs in the home and provide support for getting groceries, prescriptions, and other personal needs.  Monitor the patient's symptoms If they are getting sicker, call his or her medical provider and tell them that the patient has, or is being evaluated for, COVID-19 infection. This will help the healthcare provider's office take steps to keep other people from getting infected. Ask the healthcare provider to call the local or state health department.  Limit the number of people who have contact with the patient If possible, have only one caregiver for the patient. Other household members should stay in another home or place of residence. If this is not possible, they should stay in another room, or be separated from the patient as much as possible. Use a separate bathroom, if available. Restrict visitors who do not have an essential need to be in the home.  Keep older adults, very young children, and other sick people away from the patient Keep older adults,  very young children, and those who have compromised immune systems or chronic health conditions away from the patient. This includes people with chronic heart, lung, or kidney conditions, diabetes, and cancer.  Ensure good ventilation Make sure that shared spaces in the home have good air flow, such as from an air conditioner or an opened window, weather permitting.  Wash your hands often Wash your hands often and thoroughly with soap and water for at least 20 seconds. You can use an alcohol based hand sanitizer if soap and water are not available and if your hands are not visibly dirty. Avoid touching your eyes, nose, and mouth with unwashed hands. Use disposable paper towels to dry your hands. If not available, use dedicated cloth towels and replace them when they become wet.  Wear a facemask and gloves Wear a disposable facemask at all times in the room and gloves when you touch or have contact with the patient's blood, body fluids, and/or secretions or excretions, such as sweat, saliva, sputum, nasal mucus, vomit, urine, or feces.  Ensure the mask fits over your nose and mouth tightly, and do not touch it during use. Throw out disposable facemasks and gloves after using them. Do not reuse. Wash your hands immediately after removing  your facemask and gloves. If your personal clothing becomes contaminated, carefully remove clothing and launder. Wash your hands after handling contaminated clothing. Place all used disposable facemasks, gloves, and other waste in a lined container before disposing them with other household waste. Remove gloves and wash your hands immediately after handling these items.  Do not share dishes, glasses, or other household items with the patient Avoid sharing household items. You should not share dishes, drinking glasses, cups, eating utensils, towels, bedding, or other items with a patient who is confirmed to have, or being evaluated for, COVID-19 infection. After  the person uses these items, you should wash them thoroughly with soap and water.  Wash laundry thoroughly Immediately remove and wash clothes or bedding that have blood, body fluids, and/or secretions or excretions, such as sweat, saliva, sputum, nasal mucus, vomit, urine, or feces, on them. Wear gloves when handling laundry from the patient. Read and follow directions on labels of laundry or clothing items and detergent. In general, wash and dry with the warmest temperatures recommended on the label.  Clean all areas the individual has used often Clean all touchable surfaces, such as counters, tabletops, doorknobs, bathroom fixtures, toilets, phones, keyboards, tablets, and bedside tables, every day. Also, clean any surfaces that may have blood, body fluids, and/or secretions or excretions on them. Wear gloves when cleaning surfaces the patient has come in contact with. Use a diluted bleach solution (e.g., dilute bleach with 1 part bleach and 10 parts water) or a household disinfectant with a label that says EPA-registered for coronaviruses. To make a bleach solution at home, add 1 tablespoon of bleach to 1 quart (4 cups) of water. For a larger supply, add  cup of bleach to 1 gallon (16 cups) of water. Read labels of cleaning products and follow recommendations provided on product labels. Labels contain instructions for safe and effective use of the cleaning product including precautions you should take when applying the product, such as wearing gloves or eye protection and making sure you have good ventilation during use of the product. Remove gloves and wash hands immediately after cleaning.  Monitor yourself for signs and symptoms of illness Caregivers and household members are considered close contacts, should monitor their health, and will be asked to limit movement outside of the home to the extent possible. Follow the monitoring steps for close contacts listed on the symptom monitoring  form.   ? If you have additional questions, contact your local health department or call the epidemiologist on call at [phone] (available 24/7). ? This guidance is subject to change. For the most up-to-date guidance from [facility], please refer to their website: YouBlogs.pl

## 2019-11-30 LAB — SARS CORONAVIRUS 2 (TAT 6-24 HRS): SARS Coronavirus 2: NEGATIVE

## 2020-09-23 IMAGING — DX DG CHEST 1V PORT
1 series · 1 of 1 positions shown · non-contrast
Comparison: None.

CLINICAL DATA: Syncope.

EXAM:
PORTABLE CHEST 1 VIEW

[chest ap]
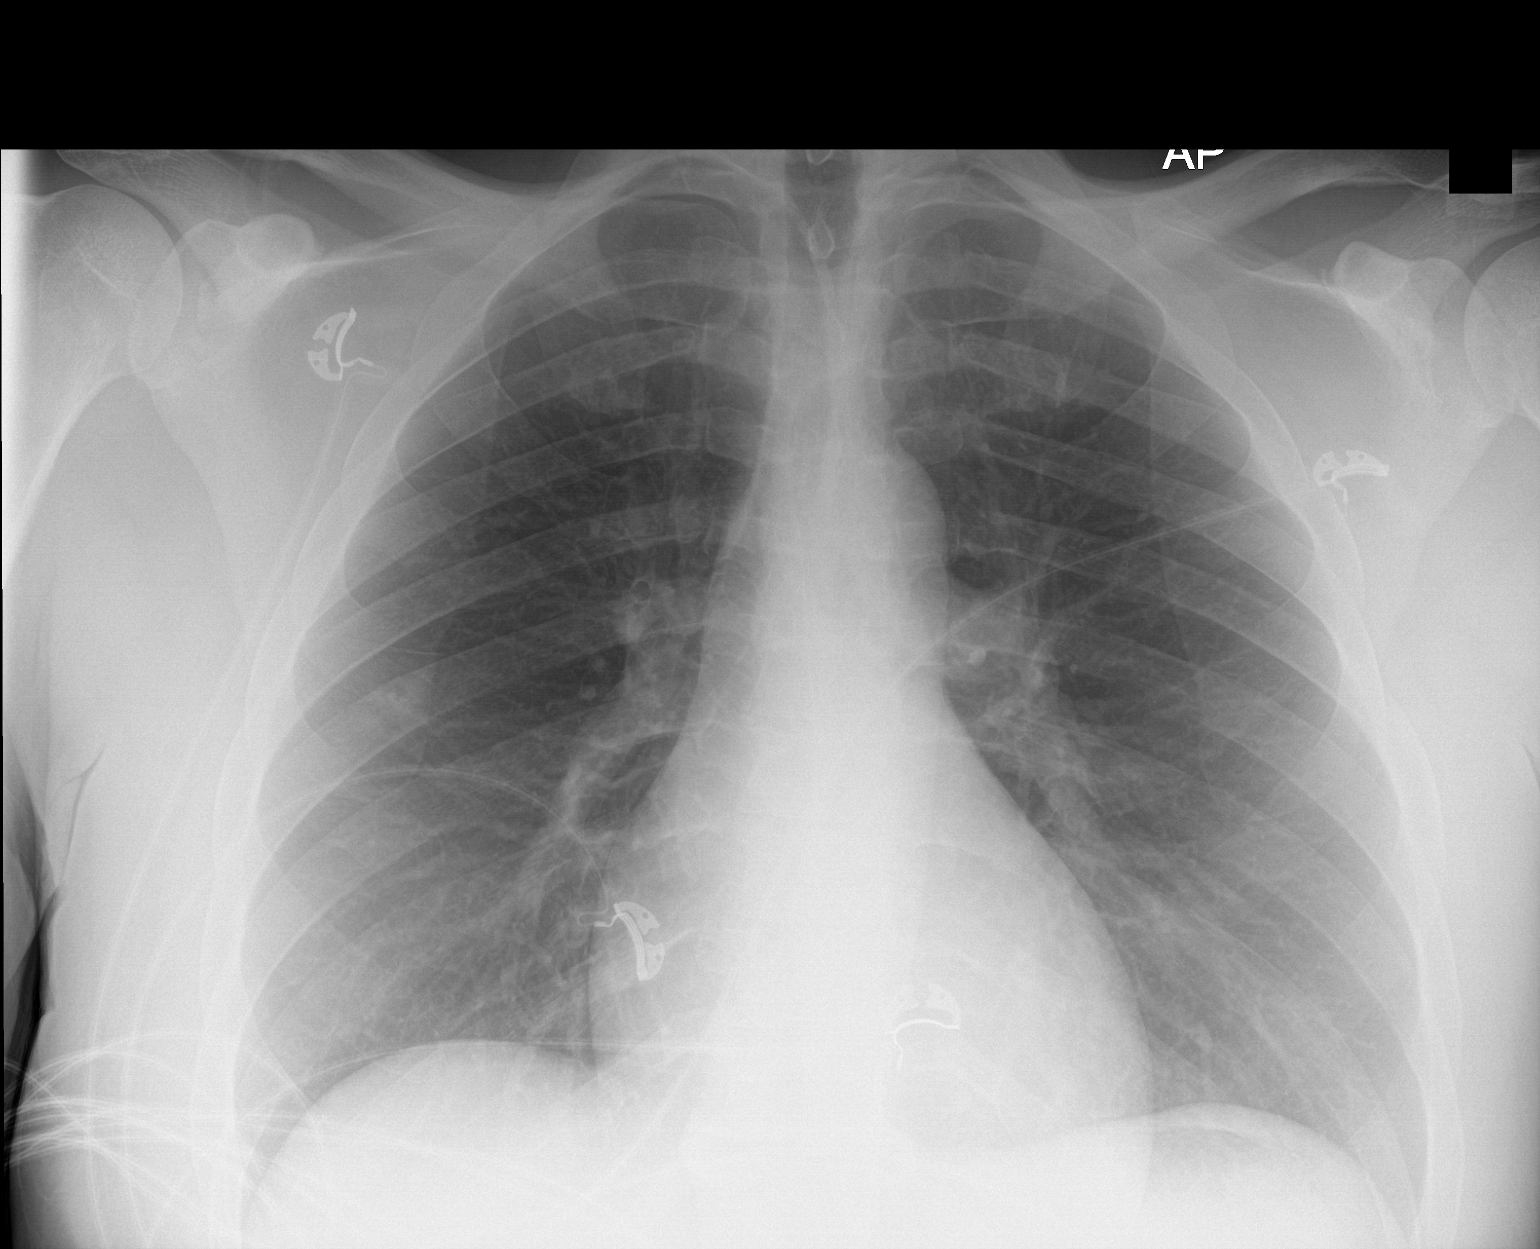

[1 of 1 positions shown; findings below may reference images not displayed]

FINDINGS: The heart, hila, and mediastinum are normal. No pneumothorax. Subtle
density/infiltrate in the right mid lung. No other acute
abnormalities.
IMPRESSION: Subtle density/infiltrate in the right mid lung may represent
pneumonia or aspiration. Recommend short-term follow-up imaging to
ensure resolution.

## 2021-05-09 ENCOUNTER — Emergency Department
Admission: EM | Admit: 2021-05-09 | Discharge: 2021-05-09 | Disposition: A | Discharge status: Home / Self Care | Payer: No Typology Code available for payment source | Source: Home / Self Care

## 2021-05-09 ENCOUNTER — Other Ambulatory Visit: Payer: Self-pay

## 2021-05-09 DIAGNOSIS — R519 Headache, unspecified: Secondary | ICD-10-CM | POA: Diagnosis not present

## 2021-05-09 NOTE — ED Triage Notes (Addendum)
Pt presents to Urgent Care with hypertension noted at dentist's office this AM. Reports going in for a dental procedure and his BP was 238/157 and then 201/144, so he was instructed to visit ED or UC. Pt does admit to HA, but states he thought the HA was related to dental pain. Denies dizziness, blurred vision.

## 2021-05-09 NOTE — Discharge Instructions (Addendum)
Advised patient may take OTC Ibuprofen 800 mg 1-2 times daily, as needed for the next 7 days.  Advised/encouraged patient may use OTC Excedrin 1-2 tabs daily as alternative for headache pain.  Encouraged patient increase daily water intake while taking these medications.  Advised/encouraged patient to check blood pressure every other day in the morning prior to breakfast and to record these measurements so that his PCP may better understand daily blood pressure trends.

## 2021-05-09 NOTE — ED Provider Notes (Signed)
[facility]    CSN: [id number] Arrival date & time: 05/09/21  1240      History   Chief Complaint Chief Complaint  Patient presents with   Hypertension   Headache    HPI Elijah Hernandez is a 45 y.o. male.   HPI 45 year old male presents with recent elevated blood pressure measures prior to recent dental procedure.  Patient reports one BP measure of 238/157 and another of 201/144.  History reviewed. No pertinent past medical history.  There are no problems to display for this patient.   Past Surgical History:  Procedure Laterality Date   HEMORRHOID SURGERY         Home Medications    Prior to Admission medications   Medication Sig Start Date End Date Taking? Authorizing Provider  cyclobenzaprine (FLEXERIL) 10 MG tablet Take 0.5-1 tablets (5-10 mg total) by mouth 2 (two) times daily as needed for muscle spasms. 04/14/18   Arthor Captain, PA-C  ibuprofen (ADVIL,MOTRIN) 600 MG tablet Take 600 mg by mouth every 8 (eight) hours as needed.    [provider]  meloxicam (MOBIC) 15 MG tablet Take 1 tablet (15 mg total) by mouth daily. 04/14/18   Arthor Captain, PA-C  predniSONE (STERAPRED UNI-PAK 21 TAB) 10 MG (21) TBPK tablet Use as directed 04/14/18   Arthor Captain, PA-C    Family History Family History  Problem Relation Age of Onset   Diabetes Mother    Cancer Father     Social History Social History   Tobacco Use   Smoking status: Every Day    Types: Cigars   Smokeless tobacco: Never  Vaping Use   Vaping Use: Never used  Substance Use Topics   Alcohol use: Yes    Comment: occasionally   Drug use: No     Allergies   Patient has no known allergies.   Review of Systems Review of Systems  Neurological:  Positive for headaches.  All other systems reviewed and are negative.   Physical Exam Triage Vital Signs ED Triage Vitals  Enc Vitals Group     BP 05/09/21 1307 125/80     Pulse Rate 05/09/21 1307 (!) 49     Resp 05/09/21  1307 20     Temp 05/09/21 1307 98.5 F (36.9 C)     Temp Source 05/09/21 1307 Oral     SpO2 05/09/21 1307 100 %     Weight 05/09/21 1303 245 lb (111.1 kg)     Height 05/09/21 1303 6\' 2"  (1.88 m)     Head Circumference --      Peak Flow --      Pain Score 05/09/21 1302 5     Pain Loc --      Pain Edu? --      Excl. in GC? --    No data found.  Updated Vital Signs BP 125/80 (BP Location: Right Arm)   Pulse (!) 49   Temp 98.5 F (36.9 C) (Oral)   Resp 20   Ht 6\' 2"  (1.88 m)   Wt 245 lb (111.1 kg)   SpO2 100%   BMI 31.46 kg/m   Physical Exam Vitals and nursing note reviewed.  Constitutional:      General: He is not in acute distress.    Appearance: Normal appearance. He is normal weight. He is not ill-appearing.  HENT:     Head: Normocephalic and atraumatic.     Right Ear: Tympanic membrane, ear canal and external ear normal.  Left Ear: Tympanic membrane, ear canal and external ear normal.     Nose: Nose normal.     Mouth/Throat:     Mouth: Mucous membranes are moist.     Pharynx: Oropharynx is clear.  Eyes:     Extraocular Movements: Extraocular movements intact.     Conjunctiva/sclera: Conjunctivae normal.     Pupils: Pupils are equal, round, and reactive to light.  Neck:     Comments: No JVD, no bruit Cardiovascular:     Rate and Rhythm: Normal rate and regular rhythm.     Pulses: Normal pulses.     Heart sounds: Normal heart sounds. No murmur heard. Pulmonary:     Effort: Pulmonary effort is normal.     Breath sounds: Normal breath sounds.     Comments: No adventitious breath sounds noted Musculoskeletal:        General: Normal range of motion.     Cervical back: Normal range of motion and neck supple. No tenderness.  Lymphadenopathy:     Cervical: No cervical adenopathy.  Skin:    General: Skin is warm and dry.  Neurological:     General: No focal deficit present.     Mental Status: He is alert and oriented to person, place, and time. Mental status  is at baseline.  Psychiatric:        Mood and Affect: Mood normal.        Behavior: Behavior normal.        Thought Content: Thought content normal.   My manual BP left arm seated 122/76  UC Treatments / Results  Labs (all labs ordered are listed, but only abnormal results are displayed) Labs Reviewed - No data to display  EKG   Radiology No results found.  Procedures Procedures (including critical care time)  Medications Ordered in UC Medications - No data to display  Initial Impression / Assessment and Plan / UC Course  I have reviewed the triage vital signs and the nursing notes.  Pertinent labs & imaging results that were available during my care of the patient were reviewed by me and considered in my medical decision making (see chart for details).     MDM: 1. Bad Headache-Advised patient may take OTC Ibuprofen 800 mg 1-2 times daily, as needed for the next 7 days.  Advised/encouraged patient may use OTC Excedrin 1-2 tabs daily as alternative for headache pain.  Encouraged patient increase daily water intake while taking these medications.  Advised/encouraged patient to check blood pressure every other day in the morning prior to breakfast and to record these measurements so that his PCP may better understand daily blood pressure trends. Final Clinical Impressions(s) / UC Diagnoses   Final diagnoses:  Bad headache     Discharge Instructions      Advised patient may take OTC Ibuprofen 800 mg 1-2 times daily, as needed for the next 7 days.  Advised/encouraged patient may use OTC Excedrin 1-2 tabs daily as alternative for headache pain.  Encouraged patient increase daily water intake while taking these medications.  Advised/encouraged patient to check blood pressure every other day in the morning prior to breakfast and to record these measurements so that his PCP may better understand daily blood pressure trends.     ED Prescriptions   None    PDMP not reviewed  this encounter.   Trevor Iha, FNP 05/09/21 1406
